# Patient Record
Sex: Male | Born: 1960 | Race: White | Hispanic: No | Smoking: Never smoker
Health system: Southern US, Community
[De-identification: ages and names within clinical notes are randomized; demographics above are authoritative.]

## PROBLEM LIST (undated history)

## (undated) DIAGNOSIS — S129XXA Fracture of neck, unspecified, initial encounter: Secondary | ICD-10-CM

## (undated) DIAGNOSIS — Z789 Other specified health status: Secondary | ICD-10-CM

## (undated) DIAGNOSIS — G709 Myoneural disorder, unspecified: Secondary | ICD-10-CM

## (undated) DIAGNOSIS — I10 Essential (primary) hypertension: Secondary | ICD-10-CM

## (undated) HISTORY — PX: CERVICAL SPINE SURGERY: SHX589

---

## 2005-06-16 DIAGNOSIS — S129XXA Fracture of neck, unspecified, initial encounter: Secondary | ICD-10-CM

## 2005-06-16 HISTORY — DX: Fracture of neck, unspecified, initial encounter: S12.9XXA

## 2006-03-21 ENCOUNTER — Inpatient Hospital Stay (HOSPITAL_COMMUNITY): Admission: EM | Admit: 2006-03-21 | Discharge: 2006-03-29 | Payer: Self-pay | Admitting: Emergency Medicine

## 2006-03-23 ENCOUNTER — Ambulatory Visit: Payer: Self-pay | Admitting: Physical Medicine & Rehabilitation

## 2006-05-20 ENCOUNTER — Encounter: Admission: RE | Admit: 2006-05-20 | Discharge: 2006-05-20 | Payer: Self-pay | Admitting: Neurosurgery

## 2006-06-04 ENCOUNTER — Ambulatory Visit (HOSPITAL_COMMUNITY): Admission: RE | Admit: 2006-06-04 | Discharge: 2006-06-05 | Payer: Self-pay | Admitting: Neurosurgery
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
## 2007-02-04 IMAGING — CR DG CERVICAL SPINE 2 OR 3 VIEWS
1 series · 1 of 1 positions shown · non-contrast
Comparison: none

CLINICAL DATA: 45 year-old with cervical spine fracture.
 CERVICAL SPINE ? 3 VIEW:

[view not recorded]
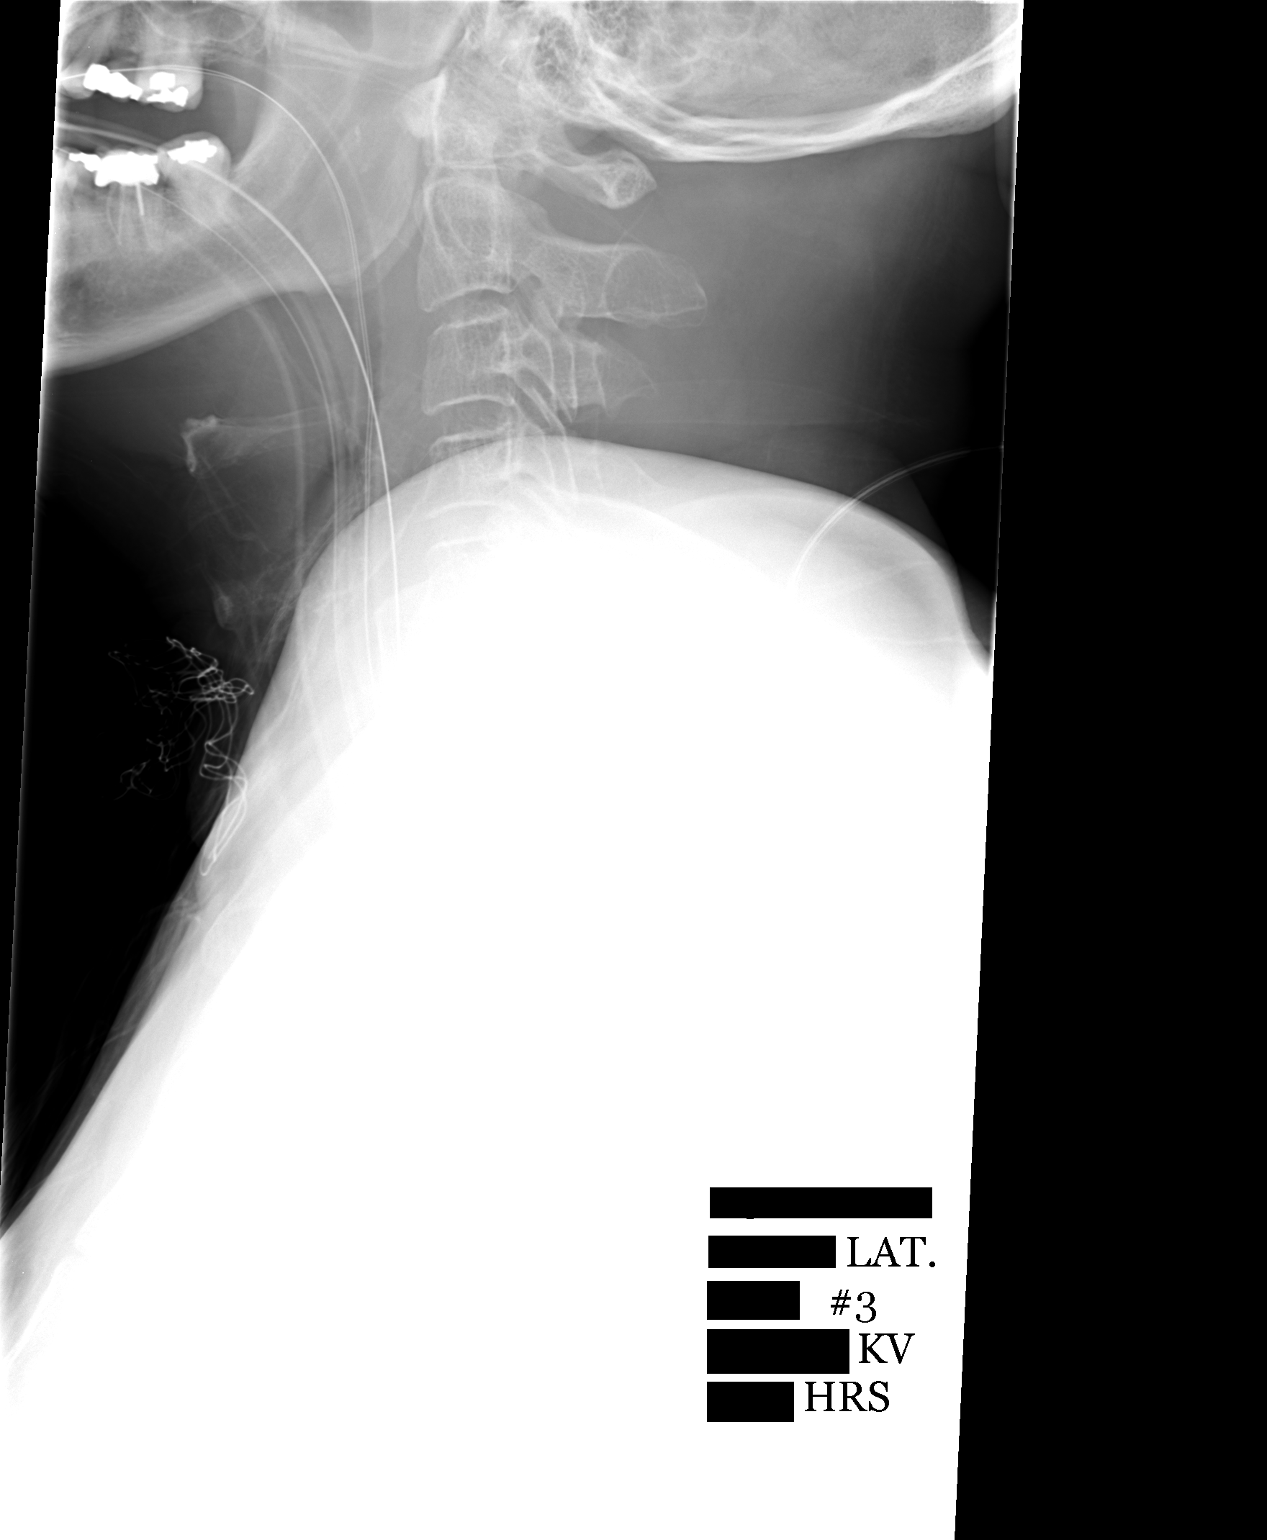

[1 of 1 positions shown; findings below may reference images not displayed]

FINDINGS: Three lateral cervical spine films from the operating room.  First film demonstrates a spinal needle projecting at the C5-6 level.  The second film shows 2 spinal needles, one is at C4-5 and the other at C5-6.  A third film, unable to see any fusion hardware or spinal needles.
IMPRESSION: C5-6 and then C4-5 and C5-6 marked intraoperatively.

## 2017-07-17 ENCOUNTER — Other Ambulatory Visit: Payer: Self-pay | Admitting: Urology

## 2017-07-20 ENCOUNTER — Encounter (HOSPITAL_BASED_OUTPATIENT_CLINIC_OR_DEPARTMENT_OTHER): Payer: Self-pay

## 2017-07-20 ENCOUNTER — Other Ambulatory Visit: Payer: Self-pay

## 2017-07-20 NOTE — Progress Notes (Signed)
Spoke with: Dorinda Hillonald "Ray" NPO:  After Midnight, no gum, candy, or mints, no chew/snuff/dip Arrival time:  0715AM Labs: Istat 8, EKG AM medications: None Pre op orders:  Yes Ride home:   Aaron Swaziland[place] (son in law) [phone]
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
## 2017-07-24 ENCOUNTER — Other Ambulatory Visit: Payer: Self-pay

## 2017-07-24 ENCOUNTER — Encounter (HOSPITAL_COMMUNITY)
Admission: RE | Disposition: A | Discharge status: Home / Self Care | Payer: Self-pay | Source: Ambulatory Visit | Attending: Urology

## 2017-07-24 ENCOUNTER — Ambulatory Visit (HOSPITAL_BASED_OUTPATIENT_CLINIC_OR_DEPARTMENT_OTHER): Payer: Managed Care, Other (non HMO) | Admitting: Anesthesiology

## 2017-07-24 ENCOUNTER — Observation Stay (HOSPITAL_BASED_OUTPATIENT_CLINIC_OR_DEPARTMENT_OTHER)
Admission: RE | Admit: 2017-07-24 | Discharge: 2017-07-25 | Disposition: A | Discharge status: Home / Self Care | Payer: Managed Care, Other (non HMO) | Source: Ambulatory Visit | Attending: Urology | Admitting: Urology

## 2017-07-24 ENCOUNTER — Encounter (HOSPITAL_BASED_OUTPATIENT_CLINIC_OR_DEPARTMENT_OTHER): Payer: Self-pay | Admitting: *Deleted

## 2017-07-24 DIAGNOSIS — G709 Myoneural disorder, unspecified: Secondary | ICD-10-CM | POA: Insufficient documentation

## 2017-07-24 DIAGNOSIS — Z9889 Other specified postprocedural states: Secondary | ICD-10-CM | POA: Insufficient documentation

## 2017-07-24 DIAGNOSIS — N402 Nodular prostate without lower urinary tract symptoms: Secondary | ICD-10-CM | POA: Diagnosis not present

## 2017-07-24 DIAGNOSIS — N529 Male erectile dysfunction, unspecified: Secondary | ICD-10-CM | POA: Diagnosis present

## 2017-07-24 DIAGNOSIS — N319 Neuromuscular dysfunction of bladder, unspecified: Secondary | ICD-10-CM | POA: Diagnosis not present

## 2017-07-24 DIAGNOSIS — F17220 Nicotine dependence, chewing tobacco, uncomplicated: Secondary | ICD-10-CM | POA: Insufficient documentation

## 2017-07-24 DIAGNOSIS — I10 Essential (primary) hypertension: Secondary | ICD-10-CM | POA: Diagnosis not present

## 2017-07-24 DIAGNOSIS — Z79890 Hormone replacement therapy (postmenopausal): Secondary | ICD-10-CM | POA: Diagnosis not present

## 2017-07-24 DIAGNOSIS — N5201 Erectile dysfunction due to arterial insufficiency: Secondary | ICD-10-CM

## 2017-07-24 DIAGNOSIS — N521 Erectile dysfunction due to diseases classified elsewhere: Secondary | ICD-10-CM | POA: Diagnosis not present

## 2017-07-24 DIAGNOSIS — Z981 Arthrodesis status: Secondary | ICD-10-CM | POA: Diagnosis not present

## 2017-07-24 DIAGNOSIS — Z8042 Family history of malignant neoplasm of prostate: Secondary | ICD-10-CM | POA: Diagnosis not present

## 2017-07-24 DIAGNOSIS — Z79899 Other long term (current) drug therapy: Secondary | ICD-10-CM | POA: Diagnosis not present

## 2017-07-24 DIAGNOSIS — I771 Stricture of artery: Secondary | ICD-10-CM | POA: Diagnosis not present

## 2017-07-24 HISTORY — DX: Essential (primary) hypertension: I10

## 2017-07-24 HISTORY — DX: Fracture of neck, unspecified, initial encounter: S12.9XXA

## 2017-07-24 HISTORY — PX: PENILE PROSTHESIS IMPLANT: SHX240

## 2017-07-24 HISTORY — DX: Other specified health status: Z78.9

## 2017-07-24 HISTORY — DX: Myoneural disorder, unspecified: G70.9

## 2017-07-24 LAB — POCT I-STAT, CHEM 8
BUN: 20 mg/dL (ref 6–20)
CHLORIDE: 105 mmol/L (ref 101–111)
CREATININE: 0.8 mg/dL (ref 0.61–1.24)
Calcium, Ion: 1.25 mmol/L (ref 1.15–1.40)
GLUCOSE: 101 mg/dL — AB (ref 65–99)
HCT: 50 % (ref 39.0–52.0)
Hemoglobin: 17 g/dL (ref 13.0–17.0)
POTASSIUM: 4 mmol/L (ref 3.5–5.1)
Sodium: 142 mmol/L (ref 135–145)
TCO2: 25 mmol/L (ref 22–32)

## 2017-07-24 SURGERY — INSERTION, PENILE PROSTHESIS, INFLATABLE
Anesthesia: General

## 2017-07-24 MED ORDER — OXYCODONE HCL 5 MG/5ML PO SOLN
5.0000 mg | Freq: Once | ORAL | Status: DC | PRN
  Filled 2017-07-24: qty 5

## 2017-07-24 MED ORDER — DIPHENHYDRAMINE HCL 12.5 MG/5ML PO ELIX
12.5000 mg | ORAL_SOLUTION | Freq: Four times a day (QID) | ORAL | Status: DC | PRN
  Filled 2017-07-24: qty 5

## 2017-07-24 MED ORDER — CHLORHEXIDINE GLUCONATE 4 % EX LIQD
Freq: Once | CUTANEOUS | Status: AC
  Administered 2017-07-24: 08:00:00 via TOPICAL
  Filled 2017-07-24: qty 15

## 2017-07-24 MED ORDER — DOCUSATE SODIUM 100 MG PO CAPS
100.0000 mg | ORAL_CAPSULE | Freq: Two times a day (BID) | ORAL | Status: DC
  Filled 2017-07-24 (×3): qty 1

## 2017-07-24 MED ORDER — FENTANYL CITRATE (PF) 100 MCG/2ML IJ SOLN
25.0000 ug | INTRAMUSCULAR | Status: DC | PRN
  Filled 2017-07-24: qty 1

## 2017-07-24 MED ORDER — ONDANSETRON HCL 4 MG/2ML IJ SOLN
4.0000 mg | INTRAMUSCULAR | Status: DC | PRN
  Filled 2017-07-24: qty 2

## 2017-07-24 MED ORDER — MORPHINE SULFATE (PF) 2 MG/ML IV SOLN
2.0000 mg | INTRAVENOUS | Status: DC | PRN
  Filled 2017-07-24: qty 2

## 2017-07-24 MED ORDER — AMPICILLIN-SULBACTAM SODIUM 3 (2-1) G IJ SOLR
INTRAMUSCULAR | Status: AC
  Filled 2017-07-24: qty 3

## 2017-07-24 MED ORDER — OXYCODONE-ACETAMINOPHEN 5-325 MG PO TABS: 1.0000 | ORAL_TABLET | Freq: Four times a day (QID) | ORAL | 0 refills | Status: AC | PRN

## 2017-07-24 MED ORDER — MIDAZOLAM HCL 2 MG/2ML IJ SOLN
INTRAMUSCULAR | Status: AC
  Filled 2017-07-24: qty 2

## 2017-07-24 MED ORDER — ACETAMINOPHEN 325 MG PO TABS
650.0000 mg | ORAL_TABLET | ORAL | Status: DC | PRN
  Filled 2017-07-24: qty 2

## 2017-07-24 MED ORDER — SODIUM CHLORIDE 0.9 % IV SOLN
3.0000 g | Freq: Once | INTRAVENOUS | Status: AC
  Administered 2017-07-24: 3 g via INTRAVENOUS
  Filled 2017-07-24: qty 3

## 2017-07-24 MED ORDER — OXYCODONE HCL 5 MG PO TABS
5.0000 mg | ORAL_TABLET | Freq: Once | ORAL | Status: DC | PRN
  Filled 2017-07-24: qty 1

## 2017-07-24 MED ORDER — DEXAMETHASONE SODIUM PHOSPHATE 10 MG/ML IJ SOLN
INTRAMUSCULAR | Status: AC
  Filled 2017-07-24: qty 1

## 2017-07-24 MED ORDER — FENTANYL CITRATE (PF) 100 MCG/2ML IJ SOLN
INTRAMUSCULAR | Status: DC | PRN
  Administered 2017-07-24 (×6): 25 ug via INTRAVENOUS

## 2017-07-24 MED ORDER — EPHEDRINE SULFATE 50 MG/ML IJ SOLN
INTRAMUSCULAR | Status: DC | PRN
  Administered 2017-07-24: 10 mg via INTRAVENOUS

## 2017-07-24 MED ORDER — DEXAMETHASONE SODIUM PHOSPHATE 4 MG/ML IJ SOLN
INTRAMUSCULAR | Status: DC | PRN
  Administered 2017-07-24: 10 mg via INTRAVENOUS

## 2017-07-24 MED ORDER — DIPHENHYDRAMINE HCL 50 MG/ML IJ SOLN
12.5000 mg | Freq: Four times a day (QID) | INTRAMUSCULAR | Status: DC | PRN
  Filled 2017-07-24: qty 0.25

## 2017-07-24 MED ORDER — ONDANSETRON HCL 4 MG/2ML IJ SOLN: INTRAMUSCULAR | Status: DC | PRN

## 2017-07-24 MED ORDER — SULFAMETHOXAZOLE-TRIMETHOPRIM 800-160 MG PO TABS: 1.0000 | ORAL_TABLET | Freq: Two times a day (BID) | ORAL | 0 refills | Status: AC

## 2017-07-24 MED ORDER — ONDANSETRON HCL 4 MG/2ML IJ SOLN
INTRAMUSCULAR | Status: DC | PRN
  Administered 2017-07-24: 4 mg via INTRAVENOUS

## 2017-07-24 MED ORDER — MIDAZOLAM HCL 5 MG/5ML IJ SOLN
INTRAMUSCULAR | Status: DC | PRN
  Administered 2017-07-24: 2 mg via INTRAVENOUS

## 2017-07-24 MED ORDER — FENTANYL CITRATE (PF) 100 MCG/2ML IJ SOLN
INTRAMUSCULAR | Status: AC
  Filled 2017-07-24: qty 2

## 2017-07-24 MED ORDER — LIDOCAINE 2% (20 MG/ML) 5 ML SYRINGE
INTRAMUSCULAR | Status: DC | PRN
  Administered 2017-07-24: 100 mg via INTRAVENOUS

## 2017-07-24 MED ORDER — LISINOPRIL 20 MG PO TABS
20.0000 mg | ORAL_TABLET | Freq: Every day | ORAL | Status: DC
  Administered 2017-07-24 – 2017-07-25 (×2): 20 mg via ORAL
  Filled 2017-07-24 (×3): qty 1

## 2017-07-24 MED ORDER — SULFAMETHOXAZOLE-TRIMETHOPRIM 800-160 MG PO TABS
1.0000 | ORAL_TABLET | Freq: Two times a day (BID) | ORAL | Status: DC
  Administered 2017-07-24 – 2017-07-25 (×3): 1 via ORAL
  Filled 2017-07-24 (×4): qty 1

## 2017-07-24 MED ORDER — PROPOFOL 10 MG/ML IV BOLUS
INTRAVENOUS | Status: AC
  Filled 2017-07-24: qty 40

## 2017-07-24 MED ORDER — OXYCODONE-ACETAMINOPHEN 5-325 MG PO TABS
1.0000 | ORAL_TABLET | ORAL | Status: DC | PRN
  Administered 2017-07-25 (×2): 1 via ORAL
  Filled 2017-07-24: qty 2
  Filled 2017-07-24 (×2): qty 1

## 2017-07-24 MED ORDER — MORPHINE SULFATE (PF) 4 MG/ML IV SOLN: 2.0000 mg | INTRAVENOUS | Status: DC | PRN

## 2017-07-24 MED ORDER — PROPOFOL 10 MG/ML IV BOLUS
INTRAVENOUS | Status: DC | PRN
  Administered 2017-07-24: 30 mg via INTRAVENOUS
  Administered 2017-07-24: 250 mg via INTRAVENOUS

## 2017-07-24 MED ORDER — ZOLPIDEM TARTRATE 5 MG PO TABS
5.0000 mg | ORAL_TABLET | Freq: Every evening | ORAL | Status: DC | PRN
  Filled 2017-07-24: qty 1

## 2017-07-24 MED ORDER — PROMETHAZINE HCL 25 MG/ML IJ SOLN
6.2500 mg | INTRAMUSCULAR | Status: DC | PRN
  Filled 2017-07-24: qty 1

## 2017-07-24 MED ORDER — BUPIVACAINE HCL (PF) 0.25 % IJ SOLN
INTRAMUSCULAR | Status: DC | PRN
  Administered 2017-07-24: 20 mL

## 2017-07-24 MED ORDER — LIDOCAINE 2% (20 MG/ML) 5 ML SYRINGE
INTRAMUSCULAR | Status: AC
  Filled 2017-07-24: qty 5

## 2017-07-24 MED ORDER — ONDANSETRON HCL 4 MG/2ML IJ SOLN
INTRAMUSCULAR | Status: AC
  Filled 2017-07-24: qty 2

## 2017-07-24 MED ORDER — LACTATED RINGERS IV SOLN
INTRAVENOUS | Status: DC
  Administered 2017-07-24 (×3): via INTRAVENOUS
  Filled 2017-07-24: qty 1000

## 2017-07-24 SURGICAL SUPPLY — 74 items
ADH SKN CLS APL DERMABOND .7 (GAUZE/BANDAGES/DRESSINGS) ×1
APL SKNCLS STERI-STRIP NONHPOA (GAUZE/BANDAGES/DRESSINGS)
APPLICATOR COTTON TIP 6IN STRL (MISCELLANEOUS) IMPLANT
BAG DECANTER FOR FLEXI CONT (MISCELLANEOUS) ×3 IMPLANT
BAG URINE DRAINAGE (UROLOGICAL SUPPLIES) ×3 IMPLANT
BANDAGE CO FLEX L/F 2IN X 5YD (GAUZE/BANDAGES/DRESSINGS) IMPLANT
BENZOIN TINCTURE PRP APPL 2/3 (GAUZE/BANDAGES/DRESSINGS) IMPLANT
BLADE CLIPPER SURG (BLADE) ×3 IMPLANT
BLADE HEX COATED 2.75 (ELECTRODE) ×3 IMPLANT
BLADE SURG 11 STRL SS (BLADE) ×3 IMPLANT
BLADE SURG 15 STRL LF DISP TIS (BLADE) ×2 IMPLANT
BLADE SURG 15 STRL SS (BLADE) ×3
BNDG GAUZE ELAST 4 BULKY (GAUZE/BANDAGES/DRESSINGS) ×3 IMPLANT
CANISTER SUCT 3000ML PPV (MISCELLANEOUS) IMPLANT
CANISTER SUCTION 1200CC (MISCELLANEOUS) ×2 IMPLANT
CATH FOLEY 2WAY SLVR  5CC 16FR (CATHETERS) ×2
CATH FOLEY 2WAY SLVR 5CC 16FR (CATHETERS) ×1 IMPLANT
CHLORAPREP W/TINT 26ML (MISCELLANEOUS) ×3 IMPLANT
CLOSURE WOUND 1/2 X4 (GAUZE/BANDAGES/DRESSINGS)
CLOTH BEACON ORANGE TIMEOUT ST (SAFETY) ×3 IMPLANT
COVER BACK TABLE 60X90IN (DRAPES) ×3 IMPLANT
COVER MAYO STAND STRL (DRAPES) ×6 IMPLANT
DERMABOND ADVANCED (GAUZE/BANDAGES/DRESSINGS) ×2
DERMABOND ADVANCED .7 DNX12 (GAUZE/BANDAGES/DRESSINGS) ×1 IMPLANT
DISSECTOR ROUND CHERRY 3/8 STR (MISCELLANEOUS) ×3 IMPLANT
DRAIN PENROSE 18X1/4 LTX STRL (WOUND CARE) IMPLANT
DRAPE EXTREMITY T 121X128X90 (DRAPE) ×3 IMPLANT
DRAPE UTILITY XL STRL (DRAPES) ×2 IMPLANT
DRSG TEGADERM 4X4.75 (GAUZE/BANDAGES/DRESSINGS) ×3 IMPLANT
DRSG TELFA 3X8 NADH (GAUZE/BANDAGES/DRESSINGS) ×3 IMPLANT
ELECT REM PT RETURN 9FT ADLT (ELECTROSURGICAL) ×3
ELECTRODE REM PT RTRN 9FT ADLT (ELECTROSURGICAL) ×1 IMPLANT
GLOVE BIO SURGEON STRL SZ8 (GLOVE) ×3 IMPLANT
GOWN STRL REUS W/ TWL LRG LVL3 (GOWN DISPOSABLE) ×1 IMPLANT
GOWN STRL REUS W/TWL LRG LVL3 (GOWN DISPOSABLE) ×3
KIT RM TURNOVER CYSTO AR (KITS) ×3 IMPLANT
KIT TITAN ASSEMBLY (Erectile Restoration) ×2 IMPLANT
KIT TITAN ASSEMBLY STANDARD (Erectile Restoration) ×1 IMPLANT
KIT TITAN ASSEMBLY STD (Erectile Restoration) IMPLANT
MANIFOLD NEPTUNE II (INSTRUMENTS) IMPLANT
NEEDLE HYPO 22GX1.5 SAFETY (NEEDLE) ×3 IMPLANT
NS IRRIG 500ML POUR BTL (IV SOLUTION) ×3 IMPLANT
PACK BASIN DAY SURGERY FS (CUSTOM PROCEDURE TRAY) ×3 IMPLANT
PAD DRESSING TELFA 3X8 NADH (GAUZE/BANDAGES/DRESSINGS) IMPLANT
PENCIL BUTTON HOLSTER BLD 10FT (ELECTRODE) ×3 IMPLANT
PLUG CATH AND CAP STER (CATHETERS) ×3 IMPLANT
PROS TITAN SCROT 0 ANG 18CM (Erectile Restoration) ×3 IMPLANT
PROSTHESIS TTN SCRO 0 ANG 18CM (Erectile Restoration) IMPLANT
RESERVOIR 75CC LOCKOUT BIOFLEX (Erectile Restoration) ×2 IMPLANT
RETRACTOR WILSON SYSTEM (INSTRUMENTS) ×3 IMPLANT
SPONGE LAP 4X18 X RAY DECT (DISPOSABLE) ×5 IMPLANT
STRIP CLOSURE SKIN 1/2X4 (GAUZE/BANDAGES/DRESSINGS) IMPLANT
SUPPORT SCROTAL LG STRP (MISCELLANEOUS) ×2 IMPLANT
SUPPORTER ATHLETIC LG (MISCELLANEOUS) ×1
SURGILUBE 2OZ TUBE FLIPTOP (MISCELLANEOUS) ×3 IMPLANT
SUT CHROMIC 3 0 SH 27 (SUTURE) ×4 IMPLANT
SUT VIC AB 2-0 SH 27 (SUTURE) ×12
SUT VIC AB 2-0 SH 27XBRD (SUTURE) ×1 IMPLANT
SUT VIC AB 2-0 UR6 27 (SUTURE) ×12 IMPLANT
SUT VIC AB 3-0 SH 27 (SUTURE)
SUT VIC AB 3-0 SH 27X BRD (SUTURE) IMPLANT
SUT VICRYL 0 UR6 27IN ABS (SUTURE) IMPLANT
SUT VICRYL 4-0 PS2 18IN ABS (SUTURE) IMPLANT
SYR 20CC LL (SYRINGE) ×3 IMPLANT
SYR 50ML LL SCALE MARK (SYRINGE) ×6 IMPLANT
SYR BULB IRRIGATION 50ML (SYRINGE) ×3 IMPLANT
SYR CONTROL 10ML LL (SYRINGE) ×3 IMPLANT
SYRINGE 10CC LL (SYRINGE) ×6 IMPLANT
TOWEL OR 17X24 6PK STRL BLUE (TOWEL DISPOSABLE) ×6 IMPLANT
TRAY DSU PREP LF (CUSTOM PROCEDURE TRAY) ×3 IMPLANT
TUBE CONNECTING 12'X1/4 (SUCTIONS) ×1
TUBE CONNECTING 12X1/4 (SUCTIONS) ×2 IMPLANT
WATER STERILE IRR 500ML POUR (IV SOLUTION) ×3 IMPLANT
YANKAUER SUCT BULB TIP NO VENT (SUCTIONS) ×3 IMPLANT

## 2017-07-24 NOTE — Brief Op Note (Signed)
07/24/2017  11:14 AM  PATIENT:  Daniel Hardin  57 y.o. male  PRE-OPERATIVE DIAGNOSIS:  ERECTILE DYSFUNCTION  POST-OPERATIVE DIAGNOSIS:  ERECTILE DYSFUNCTION  PROCEDURE:  Procedure(s) with comments: PENILE PROTHESIS INFLATABLE (N/A) - ONLY NEEDS 120 MIN FOR PROCEDURE  SURGEON:  Surgeon(s) and Role:    * Jerilee FieldEskridge, Lucendia Leard, MD - Primary  PHYSICIAN ASSISTANT:   ASSISTANTS: none   ANESTHESIA:   general  EBL:  25 mL   BLOOD ADMINISTERED:none  DRAINS: Urinary Catheter (Foley)   LOCAL MEDICATIONS USED:  MARCAINE     SPECIMEN:  No Specimen  DISPOSITION OF SPECIMEN:  N/A  COUNTS:  YES  TOURNIQUET:  * No tourniquets in log *  DICTATION: .Dragon Dictation  PLAN OF CARE: Admit for overnight observation  PATIENT DISPOSITION:  PACU - hemodynamically stable.   Delay start of Pharmacological VTE agent (>24hrs) due to surgical blood loss or risk of bleeding: yes
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
## 2017-07-24 NOTE — Anesthesia Postprocedure Evaluation (Signed)
Anesthesia Post Note  Patient: Daniel ClossDonald R Hardin  Procedure(s) Performed: PENILE PROTHESIS INFLATABLE (N/A )     Patient location during evaluation: PACU Anesthesia Type: General Level of consciousness: awake and alert Pain management: pain level controlled Vital Signs Assessment: post-procedure vital signs reviewed and stable Respiratory status: spontaneous breathing, nonlabored ventilation and respiratory function stable Cardiovascular status: blood pressure returned to baseline and stable Postop Assessment: no apparent nausea or vomiting Anesthetic complications: no    Last Vitals:  Vitals:   07/24/17 1200 07/24/17 1215  BP: (!) 149/97   Pulse: 73 (!) 42  Resp: 11 17  Temp:    SpO2: 100% 99%    Last Pain:  Vitals:   07/24/17 0707  TempSrc: Oral                 [name]

## 2017-07-24 NOTE — Plan of Care (Signed)
  Clinical Measurements: Will remain free from infection 07/24/2017 2259 - Progressing by Bertrum Sol, RN Respiratory complications will improve 07/24/2017 2259 - Completed/Met by Bertrum Sol, RN

## 2017-07-24 NOTE — Interval H&P Note (Signed)
History and Physical Interval Note:  07/24/2017 8:43 AM  Daniel Hardin  has presented today for surgery, with the diagnosis of ERECTILE DYSFUNCTION  The various methods of treatment have been discussed with the patient and family. After consideration of risks, benefits and other options for treatment, the patient has consented to  Procedure(s) with comments: PENILE PROTHESIS INFLATABLE (N/A) - ONLY NEEDS 120 MIN FOR PROCEDURE as a surgical intervention .  The patient's history has been reviewed, patient examined, no change in status, stable for surgery.  I have reviewed the patient's chart and labs.  Questions were answered to the patient's satisfaction.  He is well without fever or dysuria. I discussed with the patient the nature, potential benefits, risks and alternatives to inflatable penile prosthesis, including side effects of the proposed treatment, the likelihood of the patient achieving the goals of the procedure, and any potential problems that might occur during the procedure or recuperation. All questions answered. Patient elects to proceed.   Daniel Hardin

## 2017-07-24 NOTE — Transfer of Care (Signed)
Immediate Anesthesia Transfer of Care Note  Patient: Daniel Hardin  Procedure(s) Performed: PENILE PROTHESIS INFLATABLE (N/A )  Patient Location: PACU  Anesthesia Type:General  Level of Consciousness: awake and oriented  Airway & Oxygen Therapy: Patient Spontanous Breathing and Patient connected to nasal cannula oxygen  Post-op Assessment: Report given to RN  Post vital signs: Reviewed and stable  Last Vitals:  Vitals:   07/24/17 0707 07/24/17 1123  BP: (!) 145/81 (!) 160/82  Pulse: 72   Resp: 16 15  Temp: 36.9 C 36.5 C  SpO2: 100% 100%    Last Pain:  Vitals:   07/24/17 0707  TempSrc: Oral      Patients Stated Pain Goal: 1 (07/24/17 0723)  Complications: No apparent anesthesia complications

## 2017-07-24 NOTE — Anesthesia Procedure Notes (Signed)
Procedure Name: LMA Insertion Date/Time: 07/24/2017 9:21 AM Performed by: Briant Sitesenenny, Ambrie Carte T, CRNA Pre-anesthesia Checklist: Patient identified, Emergency Drugs available, Suction available and Patient being monitored Patient Re-evaluated:Patient Re-evaluated prior to induction Oxygen Delivery Method: Circle system utilized Preoxygenation: Pre-oxygenation with 100% oxygen Induction Type: IV induction Ventilation: Mask ventilation without difficulty LMA: LMA inserted LMA Size: 5.0 Number of attempts: 1 Airway Equipment and Method: Bite block Placement Confirmation: positive ETCO2 Tube secured with: Tape Dental Injury: Teeth and Oropharynx as per pre-operative assessment

## 2017-07-24 NOTE — Op Note (Signed)
Preoperative diagnosis: Erectile dysfunction Postoperative diagnosis: Erectile dysfunction  Procedure: Three-piece inflatable Titan Coloplast penile prosthesis  Surgeon: Mena GoesEskridge  Anesthesia: General   Indication for procedure: 57 year old white male with long history of erectile dysfunction.  He failed multiple other options and elected to proceed with penile prosthesis.  Description of procedure: After consent was obtained and was brought to the operating room.  After adequate anesthesia of the scrotum was shaved with clippers and he was scrubbed for 10 minutes.  He was then prepped with ChloraPrep.  After appropriate drying time he was draped in the usual sterile fashion.  A timeout was performed to confirm the patient and procedure.  A Foley catheter was placed without difficulty and the penile hook retractor.  A transverse scrotal incision was made and I dissected down to the corpora on each side.  2 stay sutures were placed in each corpora and corporotomies were made.  The Metzenbaums were used for the initial dilation which proceeded without difficulty or complication. I then dilated to a #12.  The proximal dilators sat at the same depth.  Irrigation through 1 of the distal corpora washed out of the other corpora and no urethral drainage was noted.  Passage of the dilators distally revealed no crossover.  The corpora were irrigated.  The distal measured 9 cm bilaterally and 10 cm proximally bilaterally.  We chose an 18 cm cylinder set with pump utilizing 1 cm rear tip extenders.  While this was prepared I went up to the pubic bone and found the external inguinal ring on the right side.  I then pierced the transversalis fascia with the Metzenbaums and then used a finger to get into the space of Retzius and clear a space for the reservoir.  I ensured the bladder was drained.  Gloves were changed and a fresh drape was placed.  I then placed a 75 cc reservoir in this pocket and filled it up and there  was no back pressure.  It was well positioned in this space.  The space was irrigated.  Attention was turned back to the corpora.  The penile prosthesis was brought on the field and I used the Furlow inserter to pass the sutures through the right and then the left glans.  The corpora were irrigated again and the prosthesis was then placed in the right and the left corpora.  It was well positioned. The sharp penis hook was removed. The prosthesis was partially inflated and provided an excellent erection.  It was deflated.  The corporotomies were then closed converting the stay sutures and a horizontal mattress.  I placed an extra 2-0 Vicryl distal to the right and the left stay sutures.  The reservoir was then connected to the pump and the device cycled normally.  I left it about two thirds inflated.  I then made a pocket in the scrotum and placed the pump.  The scrotum was small.  The pump and the tubing were covered with dartos fascia using interrupted 2-0 Vicryl.  I irrigated again.  Hemostasis was excellent. I then ran the dartos is closed near the incision for a second layer of closure.  The skin was then closed with a running horizontal 3-0 chromic suture.  The sutures at the glans were cut and removed. Telfa, fluffs and a compressive jockstrap were placed after he was cleaned up.  He was awakened and taken to the recovery room in stable condition.  Complications: None  Blood loss: Minimal  Specimens: None  Drains:  None

## 2017-07-25 DIAGNOSIS — I771 Stricture of artery: Secondary | ICD-10-CM | POA: Diagnosis not present

## 2017-07-25 LAB — HIV ANTIBODY (ROUTINE TESTING W REFLEX): HIV SCREEN 4TH GENERATION: NONREACTIVE

## 2017-07-25 NOTE — Discharge Instructions (Signed)
° °  Penile prosthesis postoperative instructions  Wound:  In most cases your incision will have absorbable sutures that will dissolve within the first 10-20 days. Some will fall out even earlier. Expect some redness as the sutures dissolved but this should occur only around the sutures. If there is generalized redness, especially with increasing pain or swelling, let us know. The scrotum and penis will very likely get "black and blue" as the blood in the tissues spread. Sometimes the whole scrotum will turn colors. The black and blue is followed by a yellow and brown color. In time, all the discoloration will go away. In some cases some firm swelling in the area of the testicle and pump may persist for up to 4-6 weeks after the surgery and is considered normal in most cases.  Diet:  You may return to your normal diet within 24 hours following your surgery. You may note some mild nausea and possibly vomiting the first 6-8 hours following surgery. This is usually due to the side effects of anesthesia, and will disappear quite soon. I would suggest clear liquids and a very light meal the first evening following your surgery.  Activity:  Your physical activity should be restricted the first 48 hours. During that time you should remain relatively inactive, moving about only when necessary. During the first 7-10 days following surgery he should avoid lifting any heavy objects (anything greater than 15 pounds), and avoid strenuous exercise. If you work, ask us specifically about your restrictions, both for work and home. We will write a note to your employer if needed.  You should plan to wear a tight pair of jockey shorts or an athletic supporter for the first 4-5 days, even to sleep. This will keep the scrotum immobilized to some degree and keep the swelling down.The position of your penis will determine what is most comfortable but I strongly urge you to keep the penis in the "up" position (toward your  head).  Ice packs should be placed on and off over the scrotum for the first 48 hours. Frozen peas or corn in a ZipLock bag can be frozen, used and re-frozen. Fifteen minutes on and 15 minutes off is a reasonable schedule. The ice is a good pain reliever and keeps the swelling down.  Hygiene: You may shower 48 hours after your surgery. Tub bathing should be restricted until the seventh day.  Medication:  You will be sent home with some type of pain medication. In many cases you will be sent home with a narcotic pain pill (hydrocodone or oxycodone). If the pain is not too bad, you may take either Tylenol (acetaminophen) or Advil (ibuprofen) which contain no narcotic agents, and might be tolerated a little better, with fewer side effects. If the pain medication you are sent home with does not control the pain, you will have to let us know. Some narcotic pain medications cannot be given or refilled by a phone call to a pharmacy.  Problems you should report to us:   Fever of 101.0 degrees Fahrenheit or greater.  Moderate or severe swelling under the skin incision or involving the scrotum. Drug reaction such as hives, a rash, nausea or vomiting.  Remove your foley catheter on Wednesday 07/29/17.

## 2017-07-25 NOTE — Progress Notes (Signed)
Patient is stable for discharge. Discharge instructions and medications have been reviewed with the patient and all questions answered. AVS and prescriptions given to patient. Reviewed foley catheter and leg bag and post-op teaching with the patient and his daughter. Patient verbalized and demonstrated understanding.   Arta Bruceeutsch, Jazzy Parmer [facility]

## 2017-07-25 NOTE — Discharge Summary (Signed)
Physician Discharge Summary      Patient ID: Daniel ClossDonald R Hardin MRN: [id number] DOB/AGE: 1961/02/06 57 y.o.  Admit date: 07/24/2017 Discharge date: 07/25/2017  Admission Diagnoses: ERECTILE DYSFUNCTION  Discharge Diagnoses:  Active Problems:   Erectile dysfunction   Discharged Condition: good  Hospital Course: He was admitted for elective inflatable penile prosthesis implantation surgery.  This was performed without complication.  Postoperatively he was monitored with a Foley catheter indwelling and on intravenous antibiotics.  He has done well with no significant complaints.  He has no severe pain but does have some discomfort as to be expected.  Foley catheter is indwelling and he performs self-catheterization so it was felt best to leave the Foley catheter indwelling for 4 days at which time it will be taken out by him at home.  He will be discharged today as he appears to be doing well and will follow up next week for a wound check.  Significant Diagnostic Studies: No results found.  Discharge Exam: Blood pressure 126/76, pulse 64, temperature (!) 97.5 F (36.4 C), temperature source Oral, resp. rate 18, height 5\' 9"  (1.753 m), weight 81.3 kg (179 lb 4.8 oz), SpO2 99 %. General: Alert, oriented and in no apparent distress. Chest: Normal respiratory effort. Cardiovascular: Regular rate and rhythm. Abdomen: Soft and nontender. GU: Dressing in place and dry.  Glans appears normal.  Foley catheter in place and draining clear urine.  Disposition:   Discharge Instructions    Discharge patient   Complete by:  As directed    Discharge disposition:  01-Home or Self Care   Discharge patient date:  07/25/2017     Allergies as of 07/25/2017   No Known Allergies     Medication List    TAKE these medications   ALEVE 220 MG Caps Generic drug:  Naproxen Sodium Take 440 mg by mouth daily as needed (pain).   lisinopril 20 MG tablet Commonly known as:  PRINIVIL,ZESTRIL Take 20 mg by  mouth daily.   lisinopril-hydrochlorothiazide 20-12.5 MG tablet Commonly known as:  PRINZIDE,ZESTORETIC Take 1 tablet by mouth every morning.   OVER THE COUNTER MEDICATION HGH   oxyCODONE-acetaminophen 5-325 MG tablet Commonly known as:  PERCOCET Take 1-2 tablets by mouth every 6 (six) hours as needed for severe pain.   sulfamethoxazole-trimethoprim 800-160 MG tablet Commonly known as:  BACTRIM DS Take 1 tablet by mouth 2 (two) times daily.   testosterone 4 MG/24HR Pt24 patch Commonly known as:  ANDRODERM Place 5 g onto the skin daily.      Follow-up Information    Call Jerilee FieldEskridge, Matthew, MD.   Specialty:  Urology Why:  For an appointment in ~1 week when you get home. Contact information: [address] [place] [address] [phone]           Signed: Garnett FarmOTTELIN,Emmamae Mcnamara C 07/25/2017, 11:10 AM

## 2017-07-27 ENCOUNTER — Encounter (HOSPITAL_BASED_OUTPATIENT_CLINIC_OR_DEPARTMENT_OTHER): Payer: Self-pay | Admitting: Urology
